# Patient Record
Sex: Female | Born: 1965 | Race: White | Hispanic: No | Smoking: Never smoker
Health system: Southern US, Community
[De-identification: ages and names within clinical notes are randomized; demographics above are authoritative.]

---

## 2002-03-01 ENCOUNTER — Encounter: Admission: RE | Admit: 2002-03-01 | Discharge: 2002-03-01 | Payer: Self-pay | Admitting: Family Medicine

## 2002-03-01 ENCOUNTER — Encounter: Payer: Self-pay | Admitting: Family Medicine

## 2003-05-13 ENCOUNTER — Encounter: Admission: RE | Admit: 2003-05-13 | Discharge: 2003-05-13 | Payer: Self-pay | Admitting: Family Medicine

## 2003-05-13 ENCOUNTER — Other Ambulatory Visit: Admission: RE | Admit: 2003-05-13 | Discharge: 2003-05-13 | Payer: Self-pay | Admitting: Family Medicine

## 2004-05-14 ENCOUNTER — Other Ambulatory Visit: Admission: RE | Admit: 2004-05-14 | Discharge: 2004-05-14 | Payer: Self-pay | Admitting: Family Medicine

## 2004-05-18 ENCOUNTER — Encounter: Admission: RE | Admit: 2004-05-18 | Discharge: 2004-05-18 | Payer: Self-pay | Admitting: Family Medicine

## 2004-11-19 ENCOUNTER — Encounter: Admission: RE | Admit: 2004-11-19 | Discharge: 2004-11-19 | Payer: Self-pay | Admitting: Family Medicine

## 2006-05-25 ENCOUNTER — Other Ambulatory Visit: Admission: RE | Admit: 2006-05-25 | Discharge: 2006-05-25 | Payer: Self-pay | Admitting: Family Medicine

## 2008-05-23 ENCOUNTER — Other Ambulatory Visit: Admission: RE | Admit: 2008-05-23 | Discharge: 2008-05-23 | Payer: Self-pay | Admitting: Family Medicine

## 2009-08-18 ENCOUNTER — Other Ambulatory Visit: Admission: RE | Admit: 2009-08-18 | Discharge: 2009-08-18 | Payer: Self-pay | Admitting: Family Medicine

## 2010-08-27 ENCOUNTER — Other Ambulatory Visit: Payer: Self-pay | Admitting: Family Medicine

## 2010-08-27 ENCOUNTER — Other Ambulatory Visit (HOSPITAL_COMMUNITY)
Admission: RE | Admit: 2010-08-27 | Discharge: 2010-08-27 | Disposition: A | Discharge status: Home / Self Care | Payer: BC Managed Care – PPO | Source: Ambulatory Visit | Attending: Family Medicine | Admitting: Family Medicine

## 2010-08-27 DIAGNOSIS — Z124 Encounter for screening for malignant neoplasm of cervix: Secondary | ICD-10-CM | POA: Insufficient documentation

## 2012-09-07 ENCOUNTER — Other Ambulatory Visit (HOSPITAL_COMMUNITY)
Admission: RE | Admit: 2012-09-07 | Discharge: 2012-09-07 | Disposition: A | Discharge status: Home / Self Care | Payer: BC Managed Care – PPO | Source: Ambulatory Visit | Attending: Family Medicine | Admitting: Family Medicine

## 2012-09-07 ENCOUNTER — Other Ambulatory Visit: Payer: Self-pay | Admitting: Family Medicine

## 2012-09-07 DIAGNOSIS — Z124 Encounter for screening for malignant neoplasm of cervix: Secondary | ICD-10-CM | POA: Insufficient documentation

## 2013-01-14 ENCOUNTER — Emergency Department (HOSPITAL_COMMUNITY)
Admission: EM | Admit: 2013-01-14 | Discharge: 2013-01-14 | Disposition: A | Discharge status: Home / Self Care | Payer: BC Managed Care – PPO | Source: Home / Self Care

## 2013-01-14 ENCOUNTER — Encounter (HOSPITAL_COMMUNITY): Payer: Self-pay

## 2013-01-14 DIAGNOSIS — M25529 Pain in unspecified elbow: Secondary | ICD-10-CM

## 2013-01-14 DIAGNOSIS — M25522 Pain in left elbow: Secondary | ICD-10-CM

## 2013-01-14 MED ORDER — MELOXICAM 15 MG PO TABS: 15.0000 mg | ORAL_TABLET | Freq: Every day | ORAL | Status: AC | PRN

## 2013-01-14 NOTE — ED Provider Notes (Signed)
Amanda Huber is a 47 y.o. female who presents to Urgent Care today for left elbow pain. Patient was lifting a heavy object today when she nearly dropped it and caught it again. She felt immediate pain in her flexor elbow in the mid radial forearm. She notes the pain is quite intense especially with elbow extension and supination. She denies any radiating pain or numbness or distal weakness. She is not tried any medications. She denies any previous injuries like this. No fevers or chills.    PMH reviewed. Healthy otherwise History  Substance Use Topics  . Smoking status: Never Smoker   . Smokeless tobacco: Not on file  . Alcohol Use: No   ROS as above Medications reviewed. No current facility-administered medications for this encounter.   Current Outpatient Prescriptions  Medication Sig Dispense Refill  . Levonorg-Eth Estrad Triphasic (TRIVORA, 28, PO) Take by mouth daily.      . meloxicam (MOBIC) 15 MG tablet Take 1 tablet (15 mg total) by mouth daily as needed for pain.  30 tablet  0    Exam:  BP 148/88  Pulse 73  Temp(Src) 98.6 F (37 C) (Oral)  Resp 18  SpO2 100% Gen: Well NAD Left elbow: Normal-appearing no ecchymosis Tender at the insertion of the distal biceps under the radius.  Tender palpation over the course of the distal biceps distally.  Pain with supination and extension.  Capillary refill sensation and grip strength intact distally.   Limited musculoskeletal ultrasound of the left medial elbow.  Biceps tendon visualized distally. It appears to be intact however I am unable to visualize the distal 2-3 cm. This is possibly due to tear or difficulty position the patient due to pain.   No results found for this or any previous visit (from the past 24 hour(s)). No results found.  Assessment and Plan: 47 y.o. female with possible distal biceps tear.  Plan to place patient into a shoulder immobilizer for for comfort as this will maintain elbow flexion. Meloxicam as  needed for pain.  Followup with orthopedics tomorrow or Tuesday.  Discussed warning signs or symptoms. Please see discharge instructions. Patient expresses understanding.      Rodolph Bong, MD 01/14/13 [phone]

## 2013-01-14 NOTE — ED Notes (Signed)
Patient c/o left arm pain, injured today lifting a fence

## 2013-01-15 ENCOUNTER — Other Ambulatory Visit: Payer: Self-pay | Admitting: Orthopedic Surgery

## 2013-01-15 DIAGNOSIS — M25522 Pain in left elbow: Secondary | ICD-10-CM

## 2013-01-16 ENCOUNTER — Ambulatory Visit
Admission: RE | Admit: 2013-01-16 | Discharge: 2013-01-16 | Disposition: A | Discharge status: Home / Self Care | Payer: BC Managed Care – PPO | Source: Ambulatory Visit | Attending: Orthopedic Surgery | Admitting: Orthopedic Surgery

## 2013-01-16 DIAGNOSIS — M25522 Pain in left elbow: Secondary | ICD-10-CM

## 2013-09-20 ENCOUNTER — Other Ambulatory Visit: Payer: Self-pay

## 2013-09-20 DIAGNOSIS — Z1231 Encounter for screening mammogram for malignant neoplasm of breast: Secondary | ICD-10-CM

## 2013-10-10 ENCOUNTER — Ambulatory Visit
Admission: RE | Admit: 2013-10-10 | Discharge: 2013-10-10 | Disposition: A | Discharge status: Home / Self Care | Payer: BC Managed Care – PPO | Source: Ambulatory Visit

## 2013-10-10 ENCOUNTER — Encounter (INDEPENDENT_AMBULATORY_CARE_PROVIDER_SITE_OTHER): Payer: Self-pay

## 2013-10-10 DIAGNOSIS — Z1231 Encounter for screening mammogram for malignant neoplasm of breast: Secondary | ICD-10-CM

## 2014-10-01 ENCOUNTER — Other Ambulatory Visit: Payer: Self-pay

## 2014-10-01 DIAGNOSIS — Z1231 Encounter for screening mammogram for malignant neoplasm of breast: Secondary | ICD-10-CM

## 2014-10-15 ENCOUNTER — Ambulatory Visit
Admission: RE | Admit: 2014-10-15 | Discharge: 2014-10-15 | Disposition: A | Discharge status: Home / Self Care | Payer: BLUE CROSS/BLUE SHIELD | Source: Ambulatory Visit

## 2014-10-15 DIAGNOSIS — Z1231 Encounter for screening mammogram for malignant neoplasm of breast: Secondary | ICD-10-CM

## 2015-09-30 ENCOUNTER — Other Ambulatory Visit: Payer: Self-pay | Admitting: Family Medicine

## 2015-09-30 ENCOUNTER — Other Ambulatory Visit (HOSPITAL_COMMUNITY)
Admission: RE | Admit: 2015-09-30 | Discharge: 2015-09-30 | Disposition: A | Discharge status: Home / Self Care | Payer: BLUE CROSS/BLUE SHIELD | Source: Ambulatory Visit | Attending: Family Medicine | Admitting: Family Medicine

## 2015-09-30 DIAGNOSIS — Z124 Encounter for screening for malignant neoplasm of cervix: Secondary | ICD-10-CM | POA: Insufficient documentation

## 2015-09-30 DIAGNOSIS — Z13220 Encounter for screening for lipoid disorders: Secondary | ICD-10-CM | POA: Diagnosis not present

## 2015-09-30 DIAGNOSIS — Z0001 Encounter for general adult medical examination with abnormal findings: Secondary | ICD-10-CM | POA: Diagnosis not present

## 2015-10-02 LAB — CYTOLOGY - PAP

## 2015-10-27 ENCOUNTER — Other Ambulatory Visit: Payer: Self-pay

## 2015-10-27 DIAGNOSIS — Z1231 Encounter for screening mammogram for malignant neoplasm of breast: Secondary | ICD-10-CM

## 2015-11-04 ENCOUNTER — Ambulatory Visit
Admission: RE | Admit: 2015-11-04 | Discharge: 2015-11-04 | Disposition: A | Discharge status: Home / Self Care | Payer: BLUE CROSS/BLUE SHIELD | Source: Ambulatory Visit

## 2015-11-04 DIAGNOSIS — Z1231 Encounter for screening mammogram for malignant neoplasm of breast: Secondary | ICD-10-CM

## 2016-01-19 DIAGNOSIS — A630 Anogenital (venereal) warts: Secondary | ICD-10-CM | POA: Diagnosis not present

## 2016-01-19 DIAGNOSIS — R350 Frequency of micturition: Secondary | ICD-10-CM | POA: Diagnosis not present

## 2016-01-19 DIAGNOSIS — Z113 Encounter for screening for infections with a predominantly sexual mode of transmission: Secondary | ICD-10-CM | POA: Diagnosis not present

## 2016-05-19 DIAGNOSIS — H524 Presbyopia: Secondary | ICD-10-CM | POA: Diagnosis not present

## 2016-09-16 DIAGNOSIS — F4323 Adjustment disorder with mixed anxiety and depressed mood: Secondary | ICD-10-CM | POA: Diagnosis not present

## 2016-09-24 ENCOUNTER — Other Ambulatory Visit: Payer: Self-pay | Admitting: Family Medicine

## 2016-09-24 DIAGNOSIS — Z1231 Encounter for screening mammogram for malignant neoplasm of breast: Secondary | ICD-10-CM

## 2016-10-19 DIAGNOSIS — Z0001 Encounter for general adult medical examination with abnormal findings: Secondary | ICD-10-CM | POA: Diagnosis not present

## 2016-11-04 ENCOUNTER — Ambulatory Visit
Admission: RE | Admit: 2016-11-04 | Discharge: 2016-11-04 | Disposition: A | Discharge status: Home / Self Care | Payer: BLUE CROSS/BLUE SHIELD | Source: Ambulatory Visit | Attending: Family Medicine | Admitting: Family Medicine

## 2016-11-04 DIAGNOSIS — Z1231 Encounter for screening mammogram for malignant neoplasm of breast: Secondary | ICD-10-CM

## 2016-11-16 DIAGNOSIS — M7712 Lateral epicondylitis, left elbow: Secondary | ICD-10-CM | POA: Diagnosis not present

## 2016-11-16 DIAGNOSIS — M25551 Pain in right hip: Secondary | ICD-10-CM | POA: Diagnosis not present

## 2016-11-16 DIAGNOSIS — M545 Low back pain: Secondary | ICD-10-CM | POA: Diagnosis not present

## 2017-01-25 DIAGNOSIS — M79671 Pain in right foot: Secondary | ICD-10-CM | POA: Diagnosis not present

## 2017-03-03 ENCOUNTER — Other Ambulatory Visit: Payer: Self-pay | Admitting: Family Medicine

## 2017-03-03 ENCOUNTER — Ambulatory Visit
Admission: RE | Admit: 2017-03-03 | Discharge: 2017-03-03 | Disposition: A | Discharge status: Home / Self Care | Payer: BLUE CROSS/BLUE SHIELD | Source: Ambulatory Visit | Attending: Family Medicine | Admitting: Family Medicine

## 2017-03-03 DIAGNOSIS — M79671 Pain in right foot: Secondary | ICD-10-CM

## 2017-03-03 DIAGNOSIS — M19071 Primary osteoarthritis, right ankle and foot: Secondary | ICD-10-CM | POA: Diagnosis not present

## 2017-03-15 DIAGNOSIS — Z23 Encounter for immunization: Secondary | ICD-10-CM | POA: Diagnosis not present

## 2017-03-16 DIAGNOSIS — Z713 Dietary counseling and surveillance: Secondary | ICD-10-CM | POA: Diagnosis not present

## 2017-03-18 DIAGNOSIS — H04123 Dry eye syndrome of bilateral lacrimal glands: Secondary | ICD-10-CM | POA: Diagnosis not present

## 2017-04-21 DIAGNOSIS — F4320 Adjustment disorder, unspecified: Secondary | ICD-10-CM | POA: Diagnosis not present

## 2017-05-03 ENCOUNTER — Ambulatory Visit
Admission: RE | Admit: 2017-05-03 | Discharge: 2017-05-03 | Disposition: A | Discharge status: Home / Self Care | Payer: BLUE CROSS/BLUE SHIELD | Source: Ambulatory Visit | Attending: Family Medicine | Admitting: Family Medicine

## 2017-05-03 ENCOUNTER — Other Ambulatory Visit: Payer: Self-pay | Admitting: Family Medicine

## 2017-05-03 DIAGNOSIS — M79644 Pain in right finger(s): Secondary | ICD-10-CM | POA: Diagnosis not present

## 2017-05-03 DIAGNOSIS — M7989 Other specified soft tissue disorders: Secondary | ICD-10-CM | POA: Diagnosis not present

## 2017-05-11 DIAGNOSIS — R52 Pain, unspecified: Secondary | ICD-10-CM | POA: Diagnosis not present

## 2017-06-20 DIAGNOSIS — Z713 Dietary counseling and surveillance: Secondary | ICD-10-CM | POA: Diagnosis not present

## 2017-08-05 DIAGNOSIS — N951 Menopausal and female climacteric states: Secondary | ICD-10-CM | POA: Diagnosis not present

## 2017-08-05 DIAGNOSIS — R635 Abnormal weight gain: Secondary | ICD-10-CM | POA: Diagnosis not present

## 2017-08-09 DIAGNOSIS — Z1339 Encounter for screening examination for other mental health and behavioral disorders: Secondary | ICD-10-CM | POA: Diagnosis not present

## 2017-08-09 DIAGNOSIS — R232 Flushing: Secondary | ICD-10-CM | POA: Diagnosis not present

## 2017-08-09 DIAGNOSIS — Z1331 Encounter for screening for depression: Secondary | ICD-10-CM | POA: Diagnosis not present

## 2017-08-09 DIAGNOSIS — R5383 Other fatigue: Secondary | ICD-10-CM | POA: Diagnosis not present

## 2017-08-09 DIAGNOSIS — G479 Sleep disorder, unspecified: Secondary | ICD-10-CM | POA: Diagnosis not present

## 2017-09-19 DIAGNOSIS — Z713 Dietary counseling and surveillance: Secondary | ICD-10-CM | POA: Diagnosis not present

## 2017-09-20 DIAGNOSIS — Z23 Encounter for immunization: Secondary | ICD-10-CM | POA: Diagnosis not present

## 2017-10-12 ENCOUNTER — Other Ambulatory Visit: Payer: Self-pay

## 2017-10-12 DIAGNOSIS — Z1231 Encounter for screening mammogram for malignant neoplasm of breast: Secondary | ICD-10-CM

## 2017-11-09 ENCOUNTER — Ambulatory Visit
Admission: RE | Admit: 2017-11-09 | Discharge: 2017-11-09 | Disposition: A | Discharge status: Home / Self Care | Payer: BLUE CROSS/BLUE SHIELD | Source: Ambulatory Visit | Attending: Family Medicine | Admitting: Family Medicine

## 2017-11-09 DIAGNOSIS — Z1231 Encounter for screening mammogram for malignant neoplasm of breast: Secondary | ICD-10-CM

## 2017-11-22 DIAGNOSIS — Z0000 Encounter for general adult medical examination without abnormal findings: Secondary | ICD-10-CM | POA: Diagnosis not present

## 2017-11-29 DIAGNOSIS — F4320 Adjustment disorder, unspecified: Secondary | ICD-10-CM | POA: Diagnosis not present

## 2017-12-02 ENCOUNTER — Ambulatory Visit: Payer: BLUE CROSS/BLUE SHIELD | Admitting: Podiatry

## 2017-12-02 ENCOUNTER — Telehealth: Payer: Self-pay | Admitting: Podiatry

## 2017-12-02 ENCOUNTER — Encounter: Payer: Self-pay | Admitting: Podiatry

## 2017-12-02 ENCOUNTER — Ambulatory Visit (INDEPENDENT_AMBULATORY_CARE_PROVIDER_SITE_OTHER): Payer: BLUE CROSS/BLUE SHIELD

## 2017-12-02 DIAGNOSIS — L84 Corns and callosities: Secondary | ICD-10-CM

## 2017-12-02 DIAGNOSIS — M6740 Ganglion, unspecified site: Secondary | ICD-10-CM

## 2017-12-02 DIAGNOSIS — M898X9 Other specified disorders of bone, unspecified site: Secondary | ICD-10-CM

## 2017-12-02 DIAGNOSIS — M7662 Achilles tendinitis, left leg: Secondary | ICD-10-CM | POA: Diagnosis not present

## 2017-12-02 MED ORDER — DICLOFENAC SODIUM 1 % TD GEL: 2.0000 g | Freq: Four times a day (QID) | TRANSDERMAL | 2 refills | Status: DC

## 2017-12-02 NOTE — Patient Instructions (Signed)
Achilles Tendinitis  with Rehab Achilles tendinitis is a disorder of the Achilles tendon. The Achilles tendon connects the large calf muscles (Gastrocnemius and Soleus) to the heel bone (calcaneus). This tendon is sometimes called the heel cord. It is important for pushing-off and standing on your toes and is important for walking, running, or jumping. Tendinitis is often caused by overuse and repetitive microtrauma. SYMPTOMS  Pain, tenderness, swelling, warmth, and redness may occur over the Achilles tendon even at rest.  Pain with pushing off, or flexing or extending the ankle.  Pain that is worsened after or during activity. CAUSES   Overuse sometimes seen with rapid increase in exercise programs or in sports requiring running and jumping.  Poor physical conditioning (strength and flexibility or endurance).  Running sports, especially training running down hills.  Inadequate warm-up before practice or play or failure to stretch before participation.  Injury to the tendon. PREVENTION   Warm up and stretch before practice or competition.  Allow time for adequate rest and recovery between practices and competition.  Keep up conditioning.  Keep up ankle and leg flexibility.  Improve or keep muscle strength and endurance.  Improve cardiovascular fitness.  Use proper technique.  Use proper equipment (shoes, skates).  To help prevent recurrence, taping, protective strapping, or an adhesive bandage may be recommended for several weeks after healing is complete. PROGNOSIS   Recovery may take weeks to several months to heal.  Longer recovery is expected if symptoms have been prolonged.  Recovery is usually quicker if the inflammation is due to a direct blow as compared with overuse or sudden strain. RELATED COMPLICATIONS   Healing time will be prolonged if the condition is not correctly treated. The injury must be given plenty of time to heal.  Symptoms can reoccur if  activity is resumed too soon.  Untreated, tendinitis may increase the risk of tendon rupture requiring additional time for recovery and possibly surgery. TREATMENT   The first treatment consists of rest anti-inflammatory medication, and ice to relieve the pain.  Stretching and strengthening exercises after resolution of pain will likely help reduce the risk of recurrence. Referral to a physical therapist or athletic trainer for further evaluation and treatment may be helpful.  A walking boot or cast may be recommended to rest the Achilles tendon. This can help break the cycle of inflammation and microtrauma.  Arch supports (orthotics) may be prescribed or recommended by your caregiver as an adjunct to therapy and rest.  Surgery to remove the inflamed tendon lining or degenerated tendon tissue is rarely necessary and has shown less than predictable results. MEDICATION   Nonsteroidal anti-inflammatory medications, such as aspirin and ibuprofen, may be used for pain and inflammation relief. Do not take within 7 days before surgery. Take these as directed by your caregiver. Contact your caregiver immediately if any bleeding, stomach upset, or signs of allergic reaction occur. Other minor pain relievers, such as acetaminophen, may also be used.  Pain relievers may be prescribed as necessary by your caregiver. Do not take prescription pain medication for longer than 4 to 7 days. Use only as directed and only as much as you need. HEAT AND COLD  Cold is used to relieve pain and reduce inflammation for acute and chronic Achilles tendinitis. Cold should be applied for 10 to 15 minutes every 2 to 3 hours for inflammation and pain and immediately after any activity that aggravates your symptoms. Use ice packs or an ice massage.  Heat may be used   before performing stretching and strengthening activities prescribed by your caregiver. Use a heat pack or a warm soak. SEEK MEDICAL CARE IF:  Symptoms get  worse or do not improve in 2 weeks despite treatment.  New, unexplained symptoms develop. Drugs used in treatment may produce side effects.   EXERCISES-- hold each stretch for 30 seconds and repeat 10 times.  Complete each stretch 3 times per day.   RANGE OF MOTION (ROM) AND STRETCHING EXERCISES - Achilles Tendinitis  These exercises may help you when beginning to rehabilitate your injury. Your symptoms may resolve with or without further involvement from your physician, physical therapist or athletic trainer. While completing these exercises, remember:   Restoring tissue flexibility helps normal motion to return to the joints. This allows healthier, less painful movement and activity.  An effective stretch should be held for at least 30 seconds.  A stretch should never be painful. You should only feel a gentle lengthening or release in the stretched tissue. STRETCH  Gastroc, Standing   Place hands on wall.  Extend right / left leg, keeping the front knee somewhat bent.  Slightly point your toes inward on your back foot.  Keeping your right / left heel on the floor and your knee straight, shift your weight toward the wall, not allowing your back to arch.  You should feel a gentle stretch in the right / left calf. Hold this position for __________ seconds. Repeat __________ times. Complete this stretch __________ times per day. STRETCH  Soleus, Standing   Place hands on wall.  Extend right / left leg, keeping the other knee somewhat bent.  Slightly point your toes inward on your back foot.  Keep your right / left heel on the floor, bend your back knee, and slightly shift your weight over the back leg so that you feel a gentle stretch deep in your back calf.  Hold this position for __________ seconds. Repeat __________ times. Complete this stretch __________ times per day. STRETCH  Gastrocsoleus, Standing  Note: This exercise can place a lot of stress on your foot and ankle.  Please complete this exercise only if specifically instructed by your caregiver.   Place the ball of your right / left foot on a step, keeping your other foot firmly on the same step.  Hold on to the wall or a rail for balance.  Slowly lift your other foot, allowing your body weight to press your heel down over the edge of the step.  You should feel a stretch in your right / left calf.  Hold this position for __________ seconds.  Repeat this exercise with a slight bend in your knee. Repeat __________ times. Complete this stretch __________ times per day.  STRENGTHENING EXERCISES - Achilles Tendinitis These exercises may help you when beginning to rehabilitate your injury. They may resolve your symptoms with or without further involvement from your physician, physical therapist or athletic trainer. While completing these exercises, remember:   Muscles can gain both the endurance and the strength needed for everyday activities through controlled exercises.  Complete these exercises as instructed by your physician, physical therapist or athletic trainer. Progress the resistance and repetitions only as guided.  You may experience muscle soreness or fatigue, but the pain or discomfort you are trying to eliminate should never worsen during these exercises. If this pain does worsen, stop and make certain you are following the directions exactly. If the pain is still present after adjustments, discontinue the exercise until you can discuss   the trouble with your clinician. STRENGTH - Plantar-flexors   Sit with your right / left leg extended. Holding onto both ends of a rubber exercise band/tubing, loop it around the ball of your foot. Keep a slight tension in the band.  Slowly push your toes away from you, pointing them downward.  Hold this position for __________ seconds. Return slowly, controlling the tension in the band/tubing. Repeat __________ times. Complete this exercise __________ times  per day.  STRENGTH - Plantar-flexors   Stand with your feet shoulder width apart. Steady yourself with a wall or table using as little support as needed.  Keeping your weight evenly spread over the width of your feet, rise up on your toes.*  Hold this position for __________ seconds. Repeat __________ times. Complete this exercise __________ times per day.  *If this is too easy, shift your weight toward your right / left leg until you feel challenged. Ultimately, you may be asked to do this exercise with your right / left foot only. STRENGTH  Plantar-flexors, Eccentric  Note: This exercise can place a lot of stress on your foot and ankle. Please complete this exercise only if specifically instructed by your caregiver.   Place the balls of your feet on a step. With your hands, use only enough support from a wall or rail to keep your balance.  Keep your knees straight and rise up on your toes.  Slowly shift your weight entirely to your right / left toes and pick up your opposite foot. Gently and with controlled movement, lower your weight through your right / left foot so that your heel drops below the level of the step. You will feel a slight stretch in the back of your calf at the end position.  Use the healthy leg to help rise up onto the balls of both feet, then lower weight only on the right / left leg again. Build up to 15 repetitions. Then progress to 3 consecutive sets of 15 repetitions.*  After completing the above exercise, complete the same exercise with a slight knee bend (about 30 degrees). Again, build up to 15 repetitions. Then progress to 3 consecutive sets of 15 repetitions.* Perform this exercise __________ times per day.  *When you easily complete 3 sets of 15, your physician, physical therapist or athletic trainer may advise you to add resistance by wearing a backpack filled with additional weight. STRENGTH - Plantar Flexors, Seated   Sit on a chair that allows your feet  to rest flat on the ground. If necessary, sit at the edge of the chair.  Keeping your toes firmly on the ground, lift your right / left heel as far as you can without increasing any discomfort in your ankle. Repeat __________ times. Complete this exercise __________ times a day. *If instructed by your physician, physical therapist or athletic trainer, you may add ____________________ of resistance by placing a weighted object on your right / left knee. Document Released: 12/30/2004 Document Revised: 08/23/2011 Document Reviewed: 09/12/2008 [employer] Patient Information 2014 [employer].    

## 2017-12-02 NOTE — Telephone Encounter (Signed)
Pt. Thought she was suppose to receive a toe wrap. She wasent sure if that came from the Pharmacy or if she was to get it from office. Please call to advise.

## 2017-12-02 NOTE — Progress Notes (Signed)
Subjective:    Patient ID: Amanda Huber, female    DOB: 1966-01-19, 52 y.o.   MRN: [id number]  HPI 52 year old female presents the office today for concerns of left foot pain describes a "cyst" to the area.  This is been ongoing for about 20 years after she drops and on her foot.  She states that she has been trying to release her shoes and she states that when doing this is taking some pressure off and she only gets pain to the area after running but she has no pain with running or with activity.  She also has a callus on the right big toe.  She has no other concerns today.  No recent swelling denies any recent injury or trauma.  No other concerns today.   Review of Systems  All other systems reviewed and are negative.  History reviewed. No pertinent past medical history.  History reviewed. No pertinent surgical history.   Current Outpatient Medications:  .  diclofenac sodium (VOLTAREN) 1 % GEL, Apply 2 g topically 4 (four) times daily. Rub into affected area of foot 2 to 4 times daily, Disp: 100 g, Rfl: 2 .  Levonorg-Eth Estrad Triphasic (TRIVORA, 28, PO), Take by mouth daily., Disp: , Rfl:  .  meloxicam (MOBIC) 15 MG tablet, Take 1 tablet (15 mg total) by mouth daily as needed for pain. (Patient not taking: Reported on 12/02/2017), Disp: 30 tablet, Rfl: 0  No Known Allergies       Objective:   Physical Exam  General: AAO x3, NAD  Dermatological: Skin is warm, dry and supple bilateral. Nails x 10 are well manicured; remaining integument appears unremarkable at this time. There are no open sores, no preulcerative lesions, no rash or signs of infection present.  Vascular: Dorsalis Pedis artery and Posterior Tibial artery pedal pulses are 2/4 bilateral with immedate capillary fill time. There is no pain with calf compression, swelling, warmth, erythema.   Neruologic: Grossly intact via light touch bilateral. Vibratory intact via tuning fork bilateral. Protective threshold with Semmes  Wienstein monofilament intact to all pedal sites bilateral.   Musculoskeletal: On the dorsal medial aspect the patient's left long first metatarsal cuneiform joint is a bony exostosis palpable.  There is no tenderness palpation there is no erythema or increase in warmth.  There is no skin breakdown.  There is no fluid-filled mass identified.  Hyperkeratotic lesion to the medial aspect of the right hallux with tenderness palpation of this area.  Also upon evaluation there is discomfort on the mid substance of the Achilles tendon she states that she is been having some discomfort in that area as well.  Overall Thompson test is negative and Achilles tendon appears to be intact.  No pain with lateral compression of the calcaneus.  No other areas of tenderness identified at this time.  Muscular strength 5/5 in all groups tested bilateral.  Gait: Unassisted, Nonantalgic.     Assessment & Plan:  52 year old female with left dorsal exostosis, right medial hallux hyperkeratotic lesion and right Achilles tendinitis -Treatment options discussed including all alternatives, risks, and complications -Etiology of symptoms were discussed -X-rays were obtained and reviewed with the patient.  x-rays were obtained on the left foot.  There is a bony exostosis palpable off of the dorsal medial foot on the first metatarsal cuneiform joint.  No evidence of acute fracture identified at this time. -Her main concern of the left foot pain.  Unfortunately is due to bone.  We discussed releasing  her shoes to not put pressure to this area we also discussed offloading pads.  Currently not symptomatically she become painful would consider steroid injection and anti-inflammatories.  Discussed supportive shoes and arch supports.  In regards to the right foot discussed stretching, icing as well as again wearing good supportive shoes.  I debrided the hyperkeratotic lesion to any complications of bleeding and dispensed offloading pad.   However she left the office without the offloading pads today but we did contact her about this.  Vivi BarrackMatthew R Karliah Kowalchuk DPM

## 2017-12-02 NOTE — Telephone Encounter (Signed)
Yes, I relized after she left she did not get it. It is one of the silicone covers for the big toe, we can send her the cover one and one with the tan cover

## 2017-12-05 ENCOUNTER — Telehealth: Payer: Self-pay | Admitting: Podiatry

## 2017-12-05 NOTE — Telephone Encounter (Signed)
Called patient on Friday June 21st and left a message stating  that I would mail these to her on Monday. Amanda Huber

## 2017-12-05 NOTE — Telephone Encounter (Signed)
Patient called about the pharmacy sending a fax to our office to get the prescription approved. Theres steps that need to be completed in order for the insurance to cover it- per her insurance company. If you can call patient back at [phone]

## 2017-12-05 NOTE — Telephone Encounter (Signed)
Tomasa HoseJ. Quintana, RN states she is currently working this one.

## 2017-12-05 NOTE — Telephone Encounter (Signed)
Thanks for working on this. Can someone just call her back to let her know that we are working on this for her? Thanks.

## 2017-12-06 ENCOUNTER — Telehealth: Payer: Self-pay | Admitting: Podiatry

## 2017-12-06 MED ORDER — DICLOFENAC SODIUM 1 % TD GEL
2.0000 g | Freq: Four times a day (QID) | TRANSDERMAL | 2 refills | Status: AC
Start: 2017-12-06 — End: ?

## 2017-12-06 NOTE — Telephone Encounter (Signed)
Left message informing pt her prescription pre-cert was being worked on.

## 2017-12-06 NOTE — Addendum Note (Signed)
Addended by: Alphia Kava'CONNELL, VALERY D on: 12/06/2017 03:47 PM   Modules accepted: Orders

## 2017-12-06 NOTE — Telephone Encounter (Signed)
Pt needs prescription re-called in due to the pre-authorization.[id number] [place]  [address]

## 2017-12-06 NOTE — Telephone Encounter (Signed)
Left message informing pt I had seen her PA request in the list to be performed and it may have been performed prior to my call to her and another request sent in between the approved one, but if it was approved [facility]

## 2017-12-06 NOTE — Telephone Encounter (Signed)
Pt called and stated that she spoke to insurance company and said they approved it until 11/2018. Please give her a call about the prescription. Patient is getting very frustrated.

## 2017-12-12 DIAGNOSIS — Z713 Dietary counseling and surveillance: Secondary | ICD-10-CM | POA: Diagnosis not present

## 2018-01-23 DIAGNOSIS — M6281 Muscle weakness (generalized): Secondary | ICD-10-CM | POA: Diagnosis not present

## 2018-01-23 DIAGNOSIS — M79604 Pain in right leg: Secondary | ICD-10-CM | POA: Diagnosis not present

## 2018-01-23 DIAGNOSIS — M79605 Pain in left leg: Secondary | ICD-10-CM | POA: Diagnosis not present

## 2018-01-25 DIAGNOSIS — M79604 Pain in right leg: Secondary | ICD-10-CM | POA: Diagnosis not present

## 2018-01-25 DIAGNOSIS — M6281 Muscle weakness (generalized): Secondary | ICD-10-CM | POA: Diagnosis not present

## 2018-01-25 DIAGNOSIS — M79605 Pain in left leg: Secondary | ICD-10-CM | POA: Diagnosis not present

## 2018-01-30 DIAGNOSIS — M79605 Pain in left leg: Secondary | ICD-10-CM | POA: Diagnosis not present

## 2018-01-30 DIAGNOSIS — M6281 Muscle weakness (generalized): Secondary | ICD-10-CM | POA: Diagnosis not present

## 2018-01-30 DIAGNOSIS — M79604 Pain in right leg: Secondary | ICD-10-CM | POA: Diagnosis not present

## 2018-02-01 DIAGNOSIS — M79604 Pain in right leg: Secondary | ICD-10-CM | POA: Diagnosis not present

## 2018-02-01 DIAGNOSIS — M6281 Muscle weakness (generalized): Secondary | ICD-10-CM | POA: Diagnosis not present

## 2018-02-01 DIAGNOSIS — M79605 Pain in left leg: Secondary | ICD-10-CM | POA: Diagnosis not present

## 2018-02-08 DIAGNOSIS — M6281 Muscle weakness (generalized): Secondary | ICD-10-CM | POA: Diagnosis not present

## 2018-02-08 DIAGNOSIS — M79604 Pain in right leg: Secondary | ICD-10-CM | POA: Diagnosis not present

## 2018-02-08 DIAGNOSIS — M79605 Pain in left leg: Secondary | ICD-10-CM | POA: Diagnosis not present

## 2018-03-23 DIAGNOSIS — H5203 Hypermetropia, bilateral: Secondary | ICD-10-CM | POA: Diagnosis not present

## 2018-04-27 DIAGNOSIS — K5900 Constipation, unspecified: Secondary | ICD-10-CM | POA: Diagnosis not present

## 2018-10-16 ENCOUNTER — Other Ambulatory Visit: Payer: Self-pay | Admitting: Family Medicine

## 2018-10-16 DIAGNOSIS — Z1231 Encounter for screening mammogram for malignant neoplasm of breast: Secondary | ICD-10-CM

## 2018-11-16 ENCOUNTER — Ambulatory Visit: Payer: BLUE CROSS/BLUE SHIELD

## 2018-12-01 DIAGNOSIS — Z0000 Encounter for general adult medical examination without abnormal findings: Secondary | ICD-10-CM | POA: Diagnosis not present

## 2018-12-13 DIAGNOSIS — I10 Essential (primary) hypertension: Secondary | ICD-10-CM | POA: Diagnosis not present

## 2018-12-14 DIAGNOSIS — R8290 Unspecified abnormal findings in urine: Secondary | ICD-10-CM | POA: Diagnosis not present

## 2018-12-14 DIAGNOSIS — I10 Essential (primary) hypertension: Secondary | ICD-10-CM | POA: Diagnosis not present

## 2018-12-26 ENCOUNTER — Ambulatory Visit
Admission: RE | Admit: 2018-12-26 | Discharge: 2018-12-26 | Disposition: A | Discharge status: Home / Self Care | Payer: BC Managed Care – PPO | Source: Ambulatory Visit | Attending: Family Medicine | Admitting: Family Medicine

## 2018-12-26 ENCOUNTER — Other Ambulatory Visit: Payer: Self-pay

## 2018-12-26 DIAGNOSIS — Z1231 Encounter for screening mammogram for malignant neoplasm of breast: Secondary | ICD-10-CM | POA: Diagnosis not present

## 2018-12-28 DIAGNOSIS — L739 Follicular disorder, unspecified: Secondary | ICD-10-CM | POA: Diagnosis not present

## 2019-02-15 ENCOUNTER — Other Ambulatory Visit: Payer: Self-pay | Admitting: Family Medicine

## 2019-02-15 DIAGNOSIS — R8290 Unspecified abnormal findings in urine: Secondary | ICD-10-CM

## 2019-02-27 ENCOUNTER — Ambulatory Visit
Admission: RE | Admit: 2019-02-27 | Discharge: 2019-02-27 | Disposition: A | Discharge status: Home / Self Care | Payer: BC Managed Care – PPO | Source: Ambulatory Visit | Attending: Family Medicine | Admitting: Family Medicine

## 2019-02-27 DIAGNOSIS — R319 Hematuria, unspecified: Secondary | ICD-10-CM | POA: Diagnosis not present

## 2019-02-27 DIAGNOSIS — R8290 Unspecified abnormal findings in urine: Secondary | ICD-10-CM

## 2019-02-28 DIAGNOSIS — M25562 Pain in left knee: Secondary | ICD-10-CM | POA: Diagnosis not present

## 2019-03-08 DIAGNOSIS — M25562 Pain in left knee: Secondary | ICD-10-CM | POA: Diagnosis not present

## 2019-03-30 DIAGNOSIS — H5203 Hypermetropia, bilateral: Secondary | ICD-10-CM | POA: Diagnosis not present

## 2019-04-13 DIAGNOSIS — R3121 Asymptomatic microscopic hematuria: Secondary | ICD-10-CM | POA: Diagnosis not present

## 2019-04-27 DIAGNOSIS — R262 Difficulty in walking, not elsewhere classified: Secondary | ICD-10-CM | POA: Diagnosis not present

## 2019-04-27 DIAGNOSIS — M25562 Pain in left knee: Secondary | ICD-10-CM | POA: Diagnosis not present

## 2019-04-27 DIAGNOSIS — M6281 Muscle weakness (generalized): Secondary | ICD-10-CM | POA: Diagnosis not present

## 2019-05-01 DIAGNOSIS — M25562 Pain in left knee: Secondary | ICD-10-CM | POA: Diagnosis not present

## 2019-05-01 DIAGNOSIS — M6281 Muscle weakness (generalized): Secondary | ICD-10-CM | POA: Diagnosis not present

## 2019-05-01 DIAGNOSIS — R262 Difficulty in walking, not elsewhere classified: Secondary | ICD-10-CM | POA: Diagnosis not present

## 2019-05-04 DIAGNOSIS — M6281 Muscle weakness (generalized): Secondary | ICD-10-CM | POA: Diagnosis not present

## 2019-05-04 DIAGNOSIS — R262 Difficulty in walking, not elsewhere classified: Secondary | ICD-10-CM | POA: Diagnosis not present

## 2019-05-04 DIAGNOSIS — M25562 Pain in left knee: Secondary | ICD-10-CM | POA: Diagnosis not present

## 2019-05-07 DIAGNOSIS — M25562 Pain in left knee: Secondary | ICD-10-CM | POA: Diagnosis not present

## 2019-05-07 DIAGNOSIS — M6281 Muscle weakness (generalized): Secondary | ICD-10-CM | POA: Diagnosis not present

## 2019-05-07 DIAGNOSIS — R262 Difficulty in walking, not elsewhere classified: Secondary | ICD-10-CM | POA: Diagnosis not present

## 2019-05-17 DIAGNOSIS — R262 Difficulty in walking, not elsewhere classified: Secondary | ICD-10-CM | POA: Diagnosis not present

## 2019-05-17 DIAGNOSIS — M25562 Pain in left knee: Secondary | ICD-10-CM | POA: Diagnosis not present

## 2019-05-17 DIAGNOSIS — M6281 Muscle weakness (generalized): Secondary | ICD-10-CM | POA: Diagnosis not present

## 2019-06-18 DIAGNOSIS — M25562 Pain in left knee: Secondary | ICD-10-CM | POA: Diagnosis not present

## 2019-07-04 DIAGNOSIS — X58XXXA Exposure to other specified factors, initial encounter: Secondary | ICD-10-CM | POA: Diagnosis not present

## 2019-07-04 DIAGNOSIS — X503XXA Overexertion from repetitive movements, initial encounter: Secondary | ICD-10-CM | POA: Diagnosis not present

## 2019-07-04 DIAGNOSIS — M94262 Chondromalacia, left knee: Secondary | ICD-10-CM | POA: Diagnosis not present

## 2019-07-04 DIAGNOSIS — S83242A Other tear of medial meniscus, current injury, left knee, initial encounter: Secondary | ICD-10-CM | POA: Diagnosis not present

## 2019-07-04 DIAGNOSIS — S83282A Other tear of lateral meniscus, current injury, left knee, initial encounter: Secondary | ICD-10-CM | POA: Diagnosis not present

## 2019-07-04 DIAGNOSIS — G8918 Other acute postprocedural pain: Secondary | ICD-10-CM | POA: Diagnosis not present

## 2019-07-17 DIAGNOSIS — M25562 Pain in left knee: Secondary | ICD-10-CM | POA: Diagnosis not present

## 2019-07-17 DIAGNOSIS — M6281 Muscle weakness (generalized): Secondary | ICD-10-CM | POA: Diagnosis not present

## 2019-07-24 DIAGNOSIS — M25562 Pain in left knee: Secondary | ICD-10-CM | POA: Diagnosis not present

## 2019-07-24 DIAGNOSIS — M6281 Muscle weakness (generalized): Secondary | ICD-10-CM | POA: Diagnosis not present

## 2019-09-06 DIAGNOSIS — M6281 Muscle weakness (generalized): Secondary | ICD-10-CM | POA: Diagnosis not present

## 2019-09-06 DIAGNOSIS — M25562 Pain in left knee: Secondary | ICD-10-CM | POA: Diagnosis not present

## 2019-10-23 DIAGNOSIS — S83241A Other tear of medial meniscus, current injury, right knee, initial encounter: Secondary | ICD-10-CM | POA: Diagnosis not present

## 2019-11-16 ENCOUNTER — Other Ambulatory Visit: Payer: Self-pay | Admitting: Family Medicine

## 2019-11-16 DIAGNOSIS — Z1231 Encounter for screening mammogram for malignant neoplasm of breast: Secondary | ICD-10-CM

## 2019-12-04 DIAGNOSIS — I10 Essential (primary) hypertension: Secondary | ICD-10-CM | POA: Diagnosis not present

## 2019-12-04 DIAGNOSIS — Z0000 Encounter for general adult medical examination without abnormal findings: Secondary | ICD-10-CM | POA: Diagnosis not present

## 2019-12-27 ENCOUNTER — Ambulatory Visit: Payer: BC Managed Care – PPO

## 2019-12-31 ENCOUNTER — Ambulatory Visit
Admission: RE | Admit: 2019-12-31 | Discharge: 2019-12-31 | Disposition: A | Discharge status: Home / Self Care | Payer: BC Managed Care – PPO | Source: Ambulatory Visit | Attending: Family Medicine | Admitting: Family Medicine

## 2019-12-31 ENCOUNTER — Other Ambulatory Visit: Payer: Self-pay

## 2019-12-31 DIAGNOSIS — Z1231 Encounter for screening mammogram for malignant neoplasm of breast: Secondary | ICD-10-CM | POA: Diagnosis not present

## 2020-01-14 DIAGNOSIS — R109 Unspecified abdominal pain: Secondary | ICD-10-CM | POA: Diagnosis not present

## 2020-03-06 DIAGNOSIS — S83241D Other tear of medial meniscus, current injury, right knee, subsequent encounter: Secondary | ICD-10-CM | POA: Diagnosis not present

## 2020-03-06 DIAGNOSIS — M7521 Bicipital tendinitis, right shoulder: Secondary | ICD-10-CM | POA: Diagnosis not present

## 2020-03-11 DIAGNOSIS — M25561 Pain in right knee: Secondary | ICD-10-CM | POA: Diagnosis not present

## 2020-04-01 DIAGNOSIS — H5203 Hypermetropia, bilateral: Secondary | ICD-10-CM | POA: Diagnosis not present

## 2020-05-02 DIAGNOSIS — S83241A Other tear of medial meniscus, current injury, right knee, initial encounter: Secondary | ICD-10-CM | POA: Diagnosis not present

## 2020-05-02 DIAGNOSIS — S83281A Other tear of lateral meniscus, current injury, right knee, initial encounter: Secondary | ICD-10-CM | POA: Diagnosis not present

## 2020-05-02 DIAGNOSIS — M94261 Chondromalacia, right knee: Secondary | ICD-10-CM | POA: Diagnosis not present

## 2020-05-02 DIAGNOSIS — G8918 Other acute postprocedural pain: Secondary | ICD-10-CM | POA: Diagnosis not present

## 2020-05-02 DIAGNOSIS — M1711 Unilateral primary osteoarthritis, right knee: Secondary | ICD-10-CM | POA: Diagnosis not present

## 2020-05-12 DIAGNOSIS — S83281D Other tear of lateral meniscus, current injury, right knee, subsequent encounter: Secondary | ICD-10-CM | POA: Diagnosis not present

## 2020-05-12 DIAGNOSIS — S83241D Other tear of medial meniscus, current injury, right knee, subsequent encounter: Secondary | ICD-10-CM | POA: Diagnosis not present

## 2020-05-13 ENCOUNTER — Ambulatory Visit (HOSPITAL_COMMUNITY)
Admission: RE | Admit: 2020-05-13 | Discharge: 2020-05-13 | Disposition: A | Discharge status: Home / Self Care | Payer: BC Managed Care – PPO | Source: Ambulatory Visit | Attending: Orthopedic Surgery | Admitting: Orthopedic Surgery

## 2020-05-13 ENCOUNTER — Other Ambulatory Visit (HOSPITAL_COMMUNITY): Payer: Self-pay | Admitting: Physician Assistant

## 2020-05-13 ENCOUNTER — Other Ambulatory Visit: Payer: Self-pay

## 2020-05-13 ENCOUNTER — Other Ambulatory Visit (HOSPITAL_COMMUNITY): Payer: Self-pay | Admitting: Orthopedic Surgery

## 2020-05-13 DIAGNOSIS — M7989 Other specified soft tissue disorders: Secondary | ICD-10-CM | POA: Insufficient documentation

## 2020-05-30 DIAGNOSIS — S83241D Other tear of medial meniscus, current injury, right knee, subsequent encounter: Secondary | ICD-10-CM | POA: Diagnosis not present

## 2020-07-04 DIAGNOSIS — Z4789 Encounter for other orthopedic aftercare: Secondary | ICD-10-CM | POA: Diagnosis not present

## 2020-07-04 DIAGNOSIS — M25561 Pain in right knee: Secondary | ICD-10-CM | POA: Diagnosis not present

## 2020-07-08 DIAGNOSIS — Z4789 Encounter for other orthopedic aftercare: Secondary | ICD-10-CM | POA: Diagnosis not present

## 2020-07-08 DIAGNOSIS — M25561 Pain in right knee: Secondary | ICD-10-CM | POA: Diagnosis not present

## 2020-07-10 DIAGNOSIS — Z4789 Encounter for other orthopedic aftercare: Secondary | ICD-10-CM | POA: Diagnosis not present

## 2020-07-10 DIAGNOSIS — M25561 Pain in right knee: Secondary | ICD-10-CM | POA: Diagnosis not present

## 2020-07-22 DIAGNOSIS — M25561 Pain in right knee: Secondary | ICD-10-CM | POA: Diagnosis not present

## 2020-08-20 DIAGNOSIS — M549 Dorsalgia, unspecified: Secondary | ICD-10-CM | POA: Diagnosis not present

## 2020-11-19 DIAGNOSIS — S60459A Superficial foreign body of unspecified finger, initial encounter: Secondary | ICD-10-CM | POA: Diagnosis not present

## 2020-11-21 ENCOUNTER — Other Ambulatory Visit: Payer: Self-pay | Admitting: Family Medicine

## 2020-11-21 DIAGNOSIS — Z1231 Encounter for screening mammogram for malignant neoplasm of breast: Secondary | ICD-10-CM

## 2020-12-11 DIAGNOSIS — I10 Essential (primary) hypertension: Secondary | ICD-10-CM | POA: Diagnosis not present

## 2020-12-11 DIAGNOSIS — Z124 Encounter for screening for malignant neoplasm of cervix: Secondary | ICD-10-CM | POA: Diagnosis not present

## 2020-12-11 DIAGNOSIS — Z0000 Encounter for general adult medical examination without abnormal findings: Secondary | ICD-10-CM | POA: Diagnosis not present

## 2020-12-28 DIAGNOSIS — M25561 Pain in right knee: Secondary | ICD-10-CM | POA: Diagnosis not present

## 2021-01-16 ENCOUNTER — Inpatient Hospital Stay: Admission: RE | Admit: 2021-01-16 | Payer: BC Managed Care – PPO | Source: Ambulatory Visit

## 2021-01-27 ENCOUNTER — Ambulatory Visit
Admission: RE | Admit: 2021-01-27 | Discharge: 2021-01-27 | Disposition: A | Discharge status: Home / Self Care | Payer: BC Managed Care – PPO | Source: Ambulatory Visit | Attending: Family Medicine | Admitting: Family Medicine

## 2021-01-27 ENCOUNTER — Other Ambulatory Visit: Payer: Self-pay

## 2021-01-27 DIAGNOSIS — Z1231 Encounter for screening mammogram for malignant neoplasm of breast: Secondary | ICD-10-CM | POA: Diagnosis not present

## 2021-03-01 DIAGNOSIS — B349 Viral infection, unspecified: Secondary | ICD-10-CM | POA: Diagnosis not present

## 2021-03-01 DIAGNOSIS — R270 Ataxia, unspecified: Secondary | ICD-10-CM | POA: Diagnosis not present

## 2021-03-02 DIAGNOSIS — U071 COVID-19: Secondary | ICD-10-CM | POA: Diagnosis not present

## 2021-03-02 DIAGNOSIS — R42 Dizziness and giddiness: Secondary | ICD-10-CM | POA: Diagnosis not present

## 2021-03-02 DIAGNOSIS — R059 Cough, unspecified: Secondary | ICD-10-CM | POA: Diagnosis not present

## 2021-03-10 DIAGNOSIS — G933 Postviral fatigue syndrome: Secondary | ICD-10-CM | POA: Diagnosis not present

## 2021-03-10 DIAGNOSIS — I10 Essential (primary) hypertension: Secondary | ICD-10-CM | POA: Diagnosis not present

## 2021-03-10 DIAGNOSIS — R059 Cough, unspecified: Secondary | ICD-10-CM | POA: Diagnosis not present

## 2021-03-25 DIAGNOSIS — I10 Essential (primary) hypertension: Secondary | ICD-10-CM | POA: Diagnosis not present

## 2021-06-02 DIAGNOSIS — H00025 Hordeolum internum left lower eyelid: Secondary | ICD-10-CM | POA: Diagnosis not present

## 2021-07-24 DIAGNOSIS — F4320 Adjustment disorder, unspecified: Secondary | ICD-10-CM | POA: Diagnosis not present

## 2021-10-12 DIAGNOSIS — L732 Hidradenitis suppurativa: Secondary | ICD-10-CM | POA: Diagnosis not present

## 2021-10-12 DIAGNOSIS — L309 Dermatitis, unspecified: Secondary | ICD-10-CM | POA: Diagnosis not present

## 2021-10-15 DIAGNOSIS — F4320 Adjustment disorder, unspecified: Secondary | ICD-10-CM | POA: Diagnosis not present

## 2021-11-25 DIAGNOSIS — F4320 Adjustment disorder, unspecified: Secondary | ICD-10-CM | POA: Diagnosis not present

## 2021-12-17 DIAGNOSIS — I10 Essential (primary) hypertension: Secondary | ICD-10-CM | POA: Diagnosis not present

## 2021-12-17 DIAGNOSIS — Z0000 Encounter for general adult medical examination without abnormal findings: Secondary | ICD-10-CM | POA: Diagnosis not present

## 2021-12-29 ENCOUNTER — Other Ambulatory Visit: Payer: Self-pay | Admitting: Family Medicine

## 2021-12-29 DIAGNOSIS — Z1231 Encounter for screening mammogram for malignant neoplasm of breast: Secondary | ICD-10-CM

## 2022-01-29 ENCOUNTER — Ambulatory Visit
Admission: RE | Admit: 2022-01-29 | Discharge: 2022-01-29 | Disposition: A | Discharge status: Home / Self Care | Payer: BC Managed Care – PPO | Source: Ambulatory Visit | Attending: Family Medicine | Admitting: Family Medicine

## 2022-01-29 DIAGNOSIS — Z1231 Encounter for screening mammogram for malignant neoplasm of breast: Secondary | ICD-10-CM | POA: Diagnosis not present

## 2022-02-05 ENCOUNTER — Institutional Professional Consult (permissible substitution): Payer: BC Managed Care – PPO | Admitting: Plastic Surgery

## 2022-03-11 ENCOUNTER — Other Ambulatory Visit: Payer: Self-pay

## 2022-03-11 DIAGNOSIS — M25511 Pain in right shoulder: Secondary | ICD-10-CM | POA: Diagnosis not present

## 2022-03-11 MED ORDER — PREDNISONE 20 MG PO TABS
ORAL_TABLET | ORAL | 0 refills | Status: AC
  Filled 2022-03-11: qty 18, 9d supply, fill #0

## 2022-04-26 DIAGNOSIS — M7501 Adhesive capsulitis of right shoulder: Secondary | ICD-10-CM | POA: Diagnosis not present

## 2022-04-26 DIAGNOSIS — M25511 Pain in right shoulder: Secondary | ICD-10-CM | POA: Diagnosis not present

## 2022-05-12 ENCOUNTER — Other Ambulatory Visit: Payer: Self-pay

## 2022-05-13 DIAGNOSIS — M9902 Segmental and somatic dysfunction of thoracic region: Secondary | ICD-10-CM | POA: Diagnosis not present

## 2022-05-13 DIAGNOSIS — M9907 Segmental and somatic dysfunction of upper extremity: Secondary | ICD-10-CM | POA: Diagnosis not present

## 2022-05-13 DIAGNOSIS — M7501 Adhesive capsulitis of right shoulder: Secondary | ICD-10-CM | POA: Diagnosis not present

## 2022-05-13 DIAGNOSIS — M25511 Pain in right shoulder: Secondary | ICD-10-CM | POA: Diagnosis not present

## 2022-05-20 DIAGNOSIS — M2550 Pain in unspecified joint: Secondary | ICD-10-CM | POA: Diagnosis not present

## 2022-05-20 DIAGNOSIS — R140 Abdominal distension (gaseous): Secondary | ICD-10-CM | POA: Diagnosis not present

## 2022-05-20 DIAGNOSIS — K921 Melena: Secondary | ICD-10-CM | POA: Diagnosis not present

## 2022-05-26 DIAGNOSIS — K921 Melena: Secondary | ICD-10-CM | POA: Diagnosis not present

## 2022-06-24 DIAGNOSIS — M25562 Pain in left knee: Secondary | ICD-10-CM | POA: Diagnosis not present

## 2022-06-24 DIAGNOSIS — M25551 Pain in right hip: Secondary | ICD-10-CM | POA: Diagnosis not present

## 2022-06-24 DIAGNOSIS — M9906 Segmental and somatic dysfunction of lower extremity: Secondary | ICD-10-CM | POA: Diagnosis not present

## 2022-06-24 DIAGNOSIS — M25561 Pain in right knee: Secondary | ICD-10-CM | POA: Diagnosis not present

## 2022-06-28 IMAGING — MG MM DIGITAL SCREENING BILAT W/ TOMO AND CAD
8 series · 9 of 24 positions shown · non-contrast
Comparison: Previous exam(s).

CLINICAL DATA: Screening.

EXAM:
DIGITAL SCREENING BILATERAL MAMMOGRAM WITH TOMOSYNTHESIS AND CAD
TECHNIQUE: Bilateral screening digital craniocaudal and mediolateral oblique
mammograms were obtained. Bilateral screening digital breast
tomosynthesis was performed. The images were evaluated with
computer-aided detection.

[L CC synth-2D]
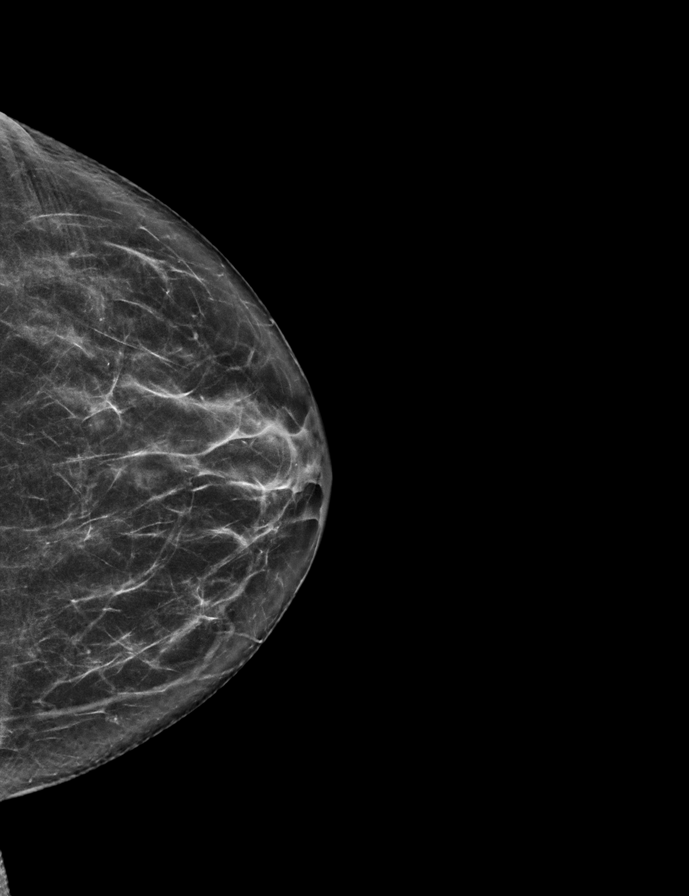

[R CC synth-2D]
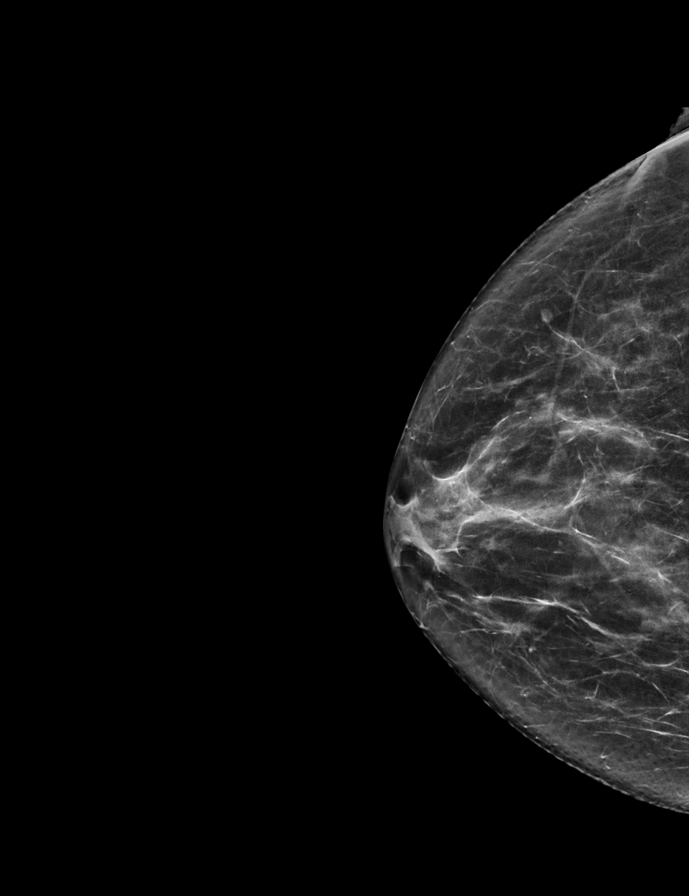

[L MLO synth-2D]
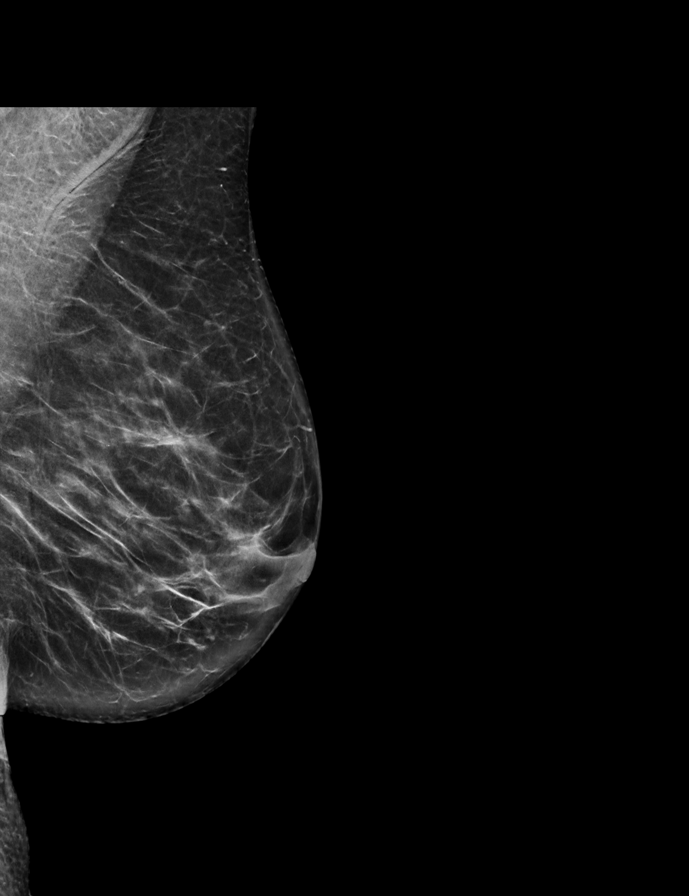

[R MLO synth-2D]
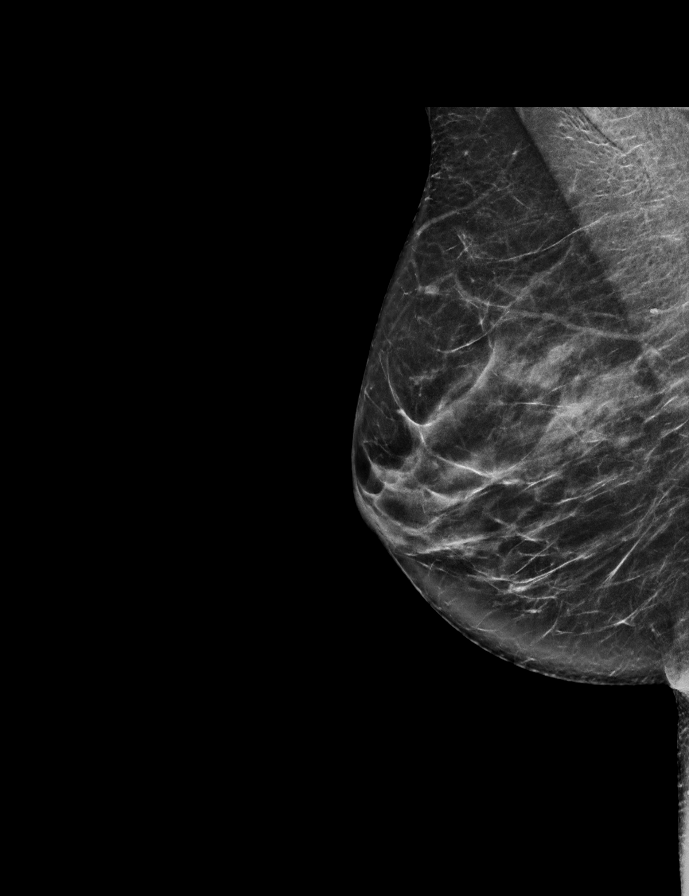

[L CC tomo · 2 of 63 frames shown]
[frame 21/63]
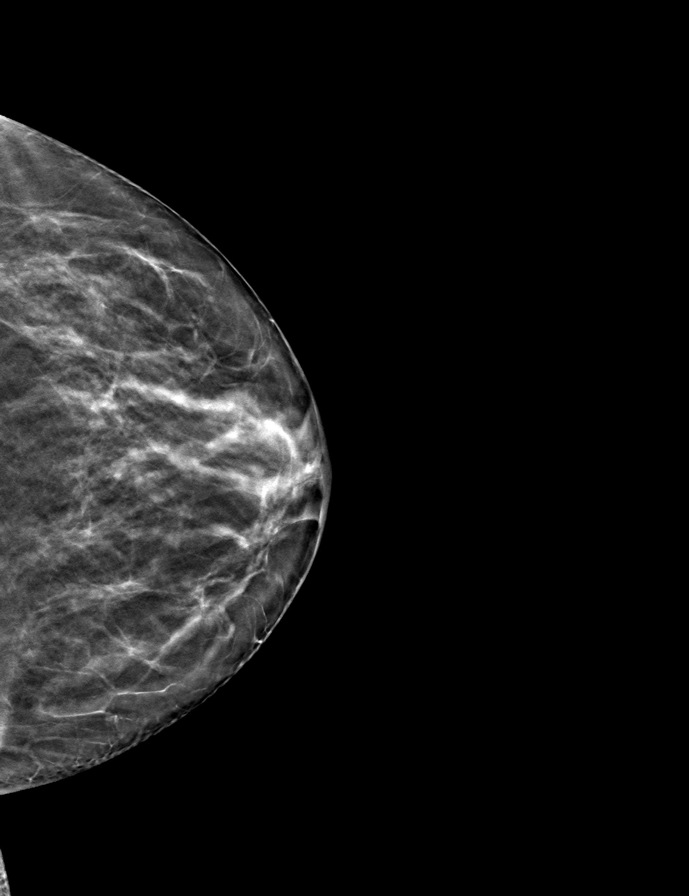
[frame 32/63]
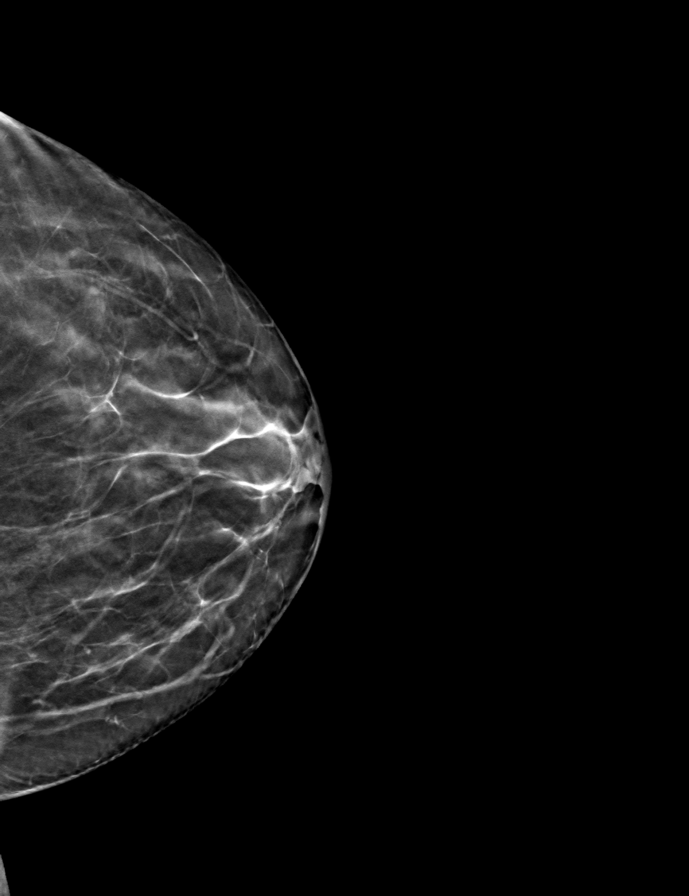

[R MLO tomo · tomo slice 33/65.0]
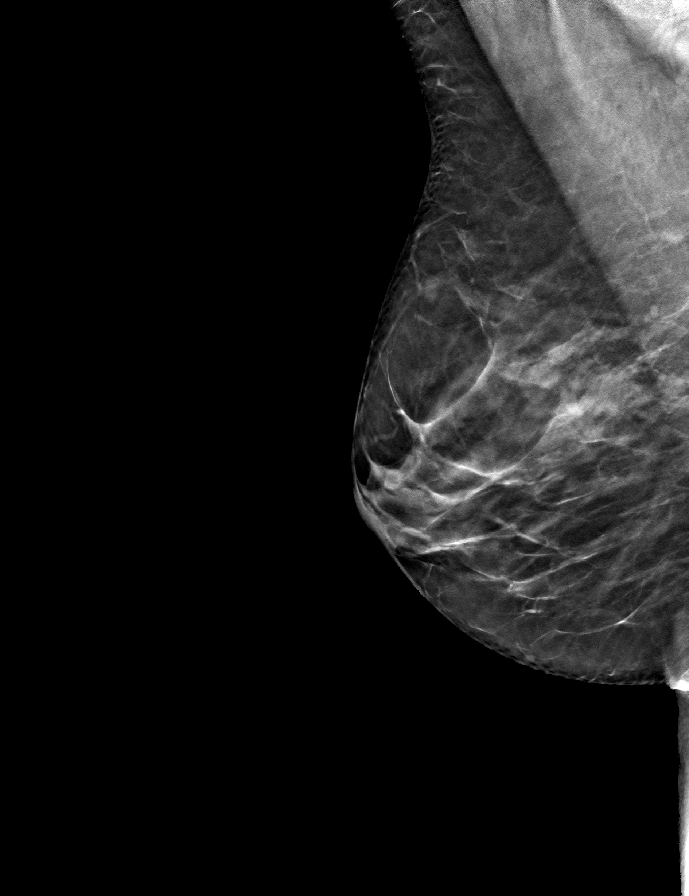

[R CC tomo · tomo slice 30/59.0]
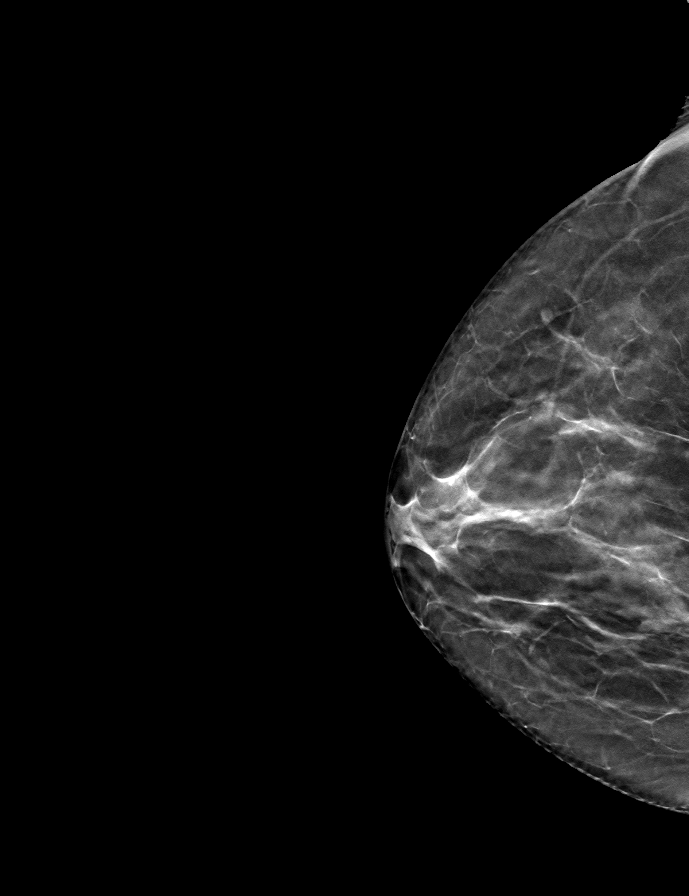

[L MLO tomo · tomo slice 35/69.0]
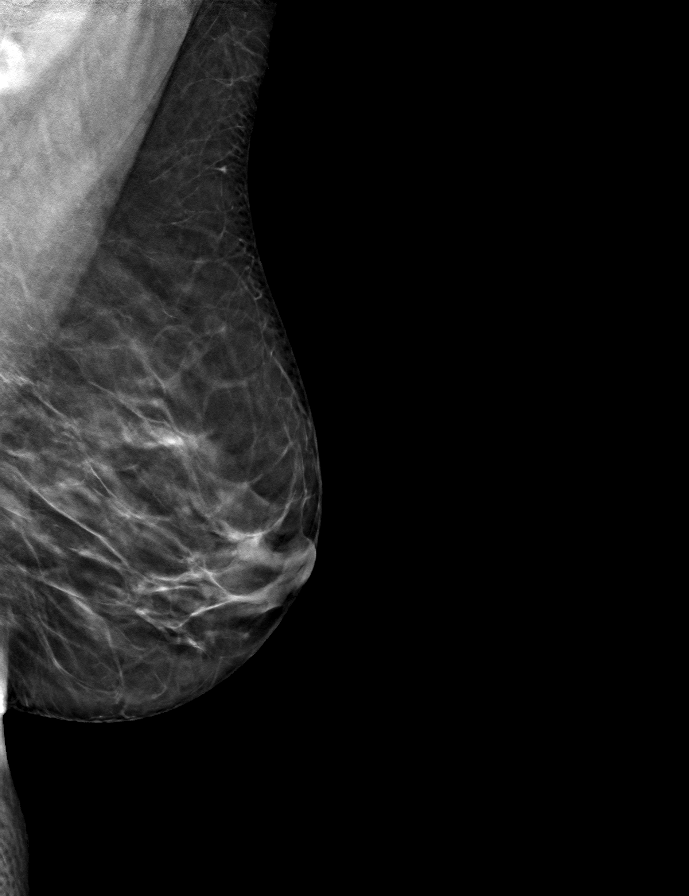

[9 of 24 positions shown; findings below may reference images not displayed]

ACR Breast Density Category c: The breast tissue is heterogeneously
dense, which may obscure small masses.
FINDINGS: There are no findings suspicious for malignancy.
IMPRESSION: No mammographic evidence of malignancy. A result letter of this
screening mammogram will be mailed directly to the patient.

RECOMMENDATION:
Screening mammogram in one year. (Code:Q3-W-BC3)

BI-RADS CATEGORY  1: Negative.

## 2022-07-26 DIAGNOSIS — R0981 Nasal congestion: Secondary | ICD-10-CM | POA: Diagnosis not present

## 2022-08-19 DIAGNOSIS — R29898 Other symptoms and signs involving the musculoskeletal system: Secondary | ICD-10-CM | POA: Diagnosis not present

## 2022-08-19 DIAGNOSIS — M7501 Adhesive capsulitis of right shoulder: Secondary | ICD-10-CM | POA: Diagnosis not present

## 2022-08-19 DIAGNOSIS — M9907 Segmental and somatic dysfunction of upper extremity: Secondary | ICD-10-CM | POA: Diagnosis not present

## 2022-08-19 DIAGNOSIS — M25511 Pain in right shoulder: Secondary | ICD-10-CM | POA: Diagnosis not present

## 2022-09-02 ENCOUNTER — Encounter: Payer: Self-pay | Admitting: Plastic Surgery

## 2022-09-02 ENCOUNTER — Ambulatory Visit (INDEPENDENT_AMBULATORY_CARE_PROVIDER_SITE_OTHER): Payer: Self-pay | Admitting: Plastic Surgery

## 2022-09-02 VITALS — BP 127/85 | HR 94 | Ht 63.0 in | Wt 140.4 lb

## 2022-09-02 DIAGNOSIS — L578 Other skin changes due to chronic exposure to nonionizing radiation: Secondary | ICD-10-CM

## 2022-09-02 NOTE — Progress Notes (Signed)
   Referring Provider Alroy Dust, L.Marlou Sa, [address] [employer] [address] [place],  [place] [address]   CC:  Chief Complaint  Patient presents with   Advice Only      Amanda Huber is an 57 y.o. female.  HPI: Amanda Huber is a very nice 57 year old female who presents today for evaluation of her skin.  The skin has extensive sun damage with a " crpe look".  She is requesting information on what can be done about this.  No Known Allergies  Outpatient Encounter Medications as of 09/02/2022  Medication Sig   Calcium 200 MG TABS 1 tablet Orally once a day   diclofenac sodium (VOLTAREN) 1 % GEL Apply 2 g topically 4 (four) times daily. Rub into affected area of foot 2 to 4 times daily   Levonorg-Eth Estrad Triphasic (TRIVORA, 28, PO) Take by mouth daily.   lisinopril (ZESTRIL) 10 MG tablet 1 tablet by mouth Once a day   Magnesium 500 MG CAPS 1 tablet with a meal Orally Once a day   predniSONE (DELTASONE) 20 MG tablet Take 3 tablets by mouth daily for 3 days, take 2 tablets daily   for 3 days, then take 1 tablet daily for 3 days   meloxicam (MOBIC) 15 MG tablet Take 1 tablet (15 mg total) by mouth daily as needed for pain. (Patient not taking: Reported on 12/02/2017)   No facility-administered encounter medications on file as of 09/02/2022.     No past medical history on file.  No past surgical history on file.  No family history on file.  Social History   Social History Narrative   Not on file     Review of Systems General: Denies fevers, chills, weight loss CV: Denies chest pain, shortness of breath, palpitations Skin: Extensive loose sun damage skin  Physical Exam    09/02/2022    2:21 PM 01/14/2013    7:06 PM  Vitals with BMI  Height 5\' 3"    Weight 140 lbs 6 oz   BMI [id number]   Systolic [id number]  Diastolic 85 88  Pulse 94 73    General:  No acute distress,  Alert and oriented, Non-Toxic, Normal speech and affect Integument: As noted extensive sun damage skin on the arms and  thighs. Mammogram: Not applicable Assessment/Plan Sun damage skin with extensive rhytids: Discussed at length options for improving the quality of the skin including increased hydration increased protein intake increased vitamin C and zinc use.  I discussed with her the importance of sunscreen use and moisturizing cream both in the morning and at night preferably with the addition of hyaluronic acid.  We discussed the possible addition of collagen supplements to her diet.  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  We did discuss nonsurgical skin treatments as well.  We do not offer any of these at this time but there are practices in town which she can contact.  Follow-up with me as desired.  Camillia Herter 09/02/2022, 3:19 PM

## 2022-09-06 DIAGNOSIS — R350 Frequency of micturition: Secondary | ICD-10-CM | POA: Diagnosis not present

## 2022-09-07 DIAGNOSIS — F4320 Adjustment disorder, unspecified: Secondary | ICD-10-CM | POA: Diagnosis not present

## 2022-09-20 ENCOUNTER — Encounter (INDEPENDENT_AMBULATORY_CARE_PROVIDER_SITE_OTHER): Payer: Self-pay

## 2022-09-20 DIAGNOSIS — Z719 Counseling, unspecified: Secondary | ICD-10-CM

## 2022-10-21 DIAGNOSIS — F4320 Adjustment disorder, unspecified: Secondary | ICD-10-CM | POA: Diagnosis not present

## 2022-10-22 DIAGNOSIS — L853 Xerosis cutis: Secondary | ICD-10-CM | POA: Diagnosis not present

## 2022-11-04 DIAGNOSIS — R319 Hematuria, unspecified: Secondary | ICD-10-CM | POA: Diagnosis not present

## 2022-11-18 DIAGNOSIS — R8290 Unspecified abnormal findings in urine: Secondary | ICD-10-CM | POA: Diagnosis not present

## 2022-12-07 DIAGNOSIS — R8290 Unspecified abnormal findings in urine: Secondary | ICD-10-CM | POA: Diagnosis not present

## 2022-12-12 DIAGNOSIS — N390 Urinary tract infection, site not specified: Secondary | ICD-10-CM | POA: Diagnosis not present

## 2022-12-21 DIAGNOSIS — Z124 Encounter for screening for malignant neoplasm of cervix: Secondary | ICD-10-CM | POA: Diagnosis not present

## 2022-12-21 DIAGNOSIS — Z23 Encounter for immunization: Secondary | ICD-10-CM | POA: Diagnosis not present

## 2022-12-21 DIAGNOSIS — I10 Essential (primary) hypertension: Secondary | ICD-10-CM | POA: Diagnosis not present

## 2022-12-21 DIAGNOSIS — N390 Urinary tract infection, site not specified: Secondary | ICD-10-CM | POA: Diagnosis not present

## 2022-12-21 DIAGNOSIS — Z13220 Encounter for screening for lipoid disorders: Secondary | ICD-10-CM | POA: Diagnosis not present

## 2022-12-21 DIAGNOSIS — Z1239 Encounter for other screening for malignant neoplasm of breast: Secondary | ICD-10-CM | POA: Diagnosis not present

## 2022-12-21 DIAGNOSIS — Z1211 Encounter for screening for malignant neoplasm of colon: Secondary | ICD-10-CM | POA: Diagnosis not present

## 2022-12-21 DIAGNOSIS — Z0189 Encounter for other specified special examinations: Secondary | ICD-10-CM | POA: Diagnosis not present

## 2023-01-04 ENCOUNTER — Other Ambulatory Visit: Payer: Self-pay | Admitting: Family Medicine

## 2023-01-04 DIAGNOSIS — Z1231 Encounter for screening mammogram for malignant neoplasm of breast: Secondary | ICD-10-CM

## 2023-01-28 DIAGNOSIS — R300 Dysuria: Secondary | ICD-10-CM | POA: Diagnosis not present

## 2023-02-01 ENCOUNTER — Ambulatory Visit
Admission: RE | Admit: 2023-02-01 | Discharge: 2023-02-01 | Disposition: A | Discharge status: Home / Self Care | Payer: BC Managed Care – PPO | Source: Ambulatory Visit | Attending: Family Medicine | Admitting: Family Medicine

## 2023-02-01 DIAGNOSIS — Z1231 Encounter for screening mammogram for malignant neoplasm of breast: Secondary | ICD-10-CM | POA: Diagnosis not present

## 2023-02-15 DIAGNOSIS — N390 Urinary tract infection, site not specified: Secondary | ICD-10-CM | POA: Diagnosis not present

## 2023-02-15 DIAGNOSIS — L739 Follicular disorder, unspecified: Secondary | ICD-10-CM | POA: Diagnosis not present

## 2023-02-28 DIAGNOSIS — R310 Gross hematuria: Secondary | ICD-10-CM | POA: Diagnosis not present

## 2023-03-09 DIAGNOSIS — R310 Gross hematuria: Secondary | ICD-10-CM | POA: Diagnosis not present

## 2023-03-09 DIAGNOSIS — I700 Atherosclerosis of aorta: Secondary | ICD-10-CM | POA: Diagnosis not present

## 2023-03-09 DIAGNOSIS — R319 Hematuria, unspecified: Secondary | ICD-10-CM | POA: Diagnosis not present

## 2023-03-31 ENCOUNTER — Other Ambulatory Visit: Payer: Self-pay | Admitting: Urology

## 2023-03-31 DIAGNOSIS — R932 Abnormal findings on diagnostic imaging of liver and biliary tract: Secondary | ICD-10-CM

## 2023-04-05 ENCOUNTER — Other Ambulatory Visit: Payer: Self-pay | Admitting: Urology

## 2023-04-05 DIAGNOSIS — R932 Abnormal findings on diagnostic imaging of liver and biliary tract: Secondary | ICD-10-CM

## 2023-04-05 DIAGNOSIS — K769 Liver disease, unspecified: Secondary | ICD-10-CM

## 2023-04-27 DIAGNOSIS — N3020 Other chronic cystitis without hematuria: Secondary | ICD-10-CM | POA: Diagnosis not present

## 2023-04-27 DIAGNOSIS — B961 Klebsiella pneumoniae [K. pneumoniae] as the cause of diseases classified elsewhere: Secondary | ICD-10-CM | POA: Diagnosis not present

## 2023-04-27 DIAGNOSIS — N390 Urinary tract infection, site not specified: Secondary | ICD-10-CM | POA: Diagnosis not present

## 2023-04-27 DIAGNOSIS — R310 Gross hematuria: Secondary | ICD-10-CM | POA: Diagnosis not present

## 2023-05-14 ENCOUNTER — Ambulatory Visit
Admission: RE | Admit: 2023-05-14 | Discharge: 2023-05-14 | Disposition: A | Discharge status: Home / Self Care | Payer: BC Managed Care – PPO | Source: Ambulatory Visit | Attending: Urology | Admitting: Urology

## 2023-05-14 DIAGNOSIS — I700 Atherosclerosis of aorta: Secondary | ICD-10-CM | POA: Diagnosis not present

## 2023-05-14 DIAGNOSIS — K769 Liver disease, unspecified: Secondary | ICD-10-CM

## 2023-05-14 DIAGNOSIS — R932 Abnormal findings on diagnostic imaging of liver and biliary tract: Secondary | ICD-10-CM

## 2023-05-14 MED ORDER — GADOPICLENOL 0.5 MMOL/ML IV SOLN
6.0000 mL | Freq: Once | INTRAVENOUS | Status: AC | PRN
  Administered 2023-05-14: 6 mL via INTRAVENOUS

## 2023-06-16 DIAGNOSIS — N390 Urinary tract infection, site not specified: Secondary | ICD-10-CM | POA: Diagnosis not present

## 2023-06-16 DIAGNOSIS — N3020 Other chronic cystitis without hematuria: Secondary | ICD-10-CM | POA: Diagnosis not present

## 2023-06-16 DIAGNOSIS — R310 Gross hematuria: Secondary | ICD-10-CM | POA: Diagnosis not present

## 2023-06-16 DIAGNOSIS — B951 Streptococcus, group B, as the cause of diseases classified elsewhere: Secondary | ICD-10-CM | POA: Diagnosis not present

## 2023-07-11 DIAGNOSIS — M25562 Pain in left knee: Secondary | ICD-10-CM | POA: Diagnosis not present

## 2023-08-01 DIAGNOSIS — N3020 Other chronic cystitis without hematuria: Secondary | ICD-10-CM | POA: Diagnosis not present

## 2023-08-10 DIAGNOSIS — F4320 Adjustment disorder, unspecified: Secondary | ICD-10-CM | POA: Diagnosis not present

## 2023-08-24 DIAGNOSIS — R110 Nausea: Secondary | ICD-10-CM | POA: Diagnosis not present

## 2023-08-24 DIAGNOSIS — R10816 Epigastric abdominal tenderness: Secondary | ICD-10-CM | POA: Diagnosis not present

## 2023-08-29 ENCOUNTER — Other Ambulatory Visit: Payer: Self-pay | Admitting: Physician Assistant

## 2023-08-29 DIAGNOSIS — R110 Nausea: Secondary | ICD-10-CM

## 2023-08-29 DIAGNOSIS — R10816 Epigastric abdominal tenderness: Secondary | ICD-10-CM

## 2023-09-02 ENCOUNTER — Other Ambulatory Visit

## 2023-11-03 DIAGNOSIS — F4320 Adjustment disorder, unspecified: Secondary | ICD-10-CM | POA: Diagnosis not present

## 2023-11-17 DIAGNOSIS — M25562 Pain in left knee: Secondary | ICD-10-CM | POA: Diagnosis not present

## 2023-11-17 DIAGNOSIS — M25561 Pain in right knee: Secondary | ICD-10-CM | POA: Diagnosis not present

## 2023-12-19 DIAGNOSIS — R8290 Unspecified abnormal findings in urine: Secondary | ICD-10-CM | POA: Diagnosis not present

## 2023-12-19 DIAGNOSIS — N390 Urinary tract infection, site not specified: Secondary | ICD-10-CM | POA: Diagnosis not present

## 2023-12-19 DIAGNOSIS — J988 Other specified respiratory disorders: Secondary | ICD-10-CM | POA: Diagnosis not present

## 2023-12-19 DIAGNOSIS — Z13220 Encounter for screening for lipoid disorders: Secondary | ICD-10-CM | POA: Diagnosis not present

## 2023-12-19 DIAGNOSIS — D649 Anemia, unspecified: Secondary | ICD-10-CM | POA: Diagnosis not present

## 2023-12-19 DIAGNOSIS — I10 Essential (primary) hypertension: Secondary | ICD-10-CM | POA: Diagnosis not present

## 2023-12-22 ENCOUNTER — Other Ambulatory Visit: Payer: Self-pay | Admitting: Family Medicine

## 2023-12-22 DIAGNOSIS — M2550 Pain in unspecified joint: Secondary | ICD-10-CM | POA: Diagnosis not present

## 2023-12-22 DIAGNOSIS — Z0189 Encounter for other specified special examinations: Secondary | ICD-10-CM | POA: Diagnosis not present

## 2023-12-22 DIAGNOSIS — I10 Essential (primary) hypertension: Secondary | ICD-10-CM | POA: Diagnosis not present

## 2023-12-22 DIAGNOSIS — J988 Other specified respiratory disorders: Secondary | ICD-10-CM | POA: Diagnosis not present

## 2023-12-22 DIAGNOSIS — Z1231 Encounter for screening mammogram for malignant neoplasm of breast: Secondary | ICD-10-CM

## 2023-12-22 DIAGNOSIS — Z13220 Encounter for screening for lipoid disorders: Secondary | ICD-10-CM | POA: Diagnosis not present

## 2023-12-28 DIAGNOSIS — F4320 Adjustment disorder, unspecified: Secondary | ICD-10-CM | POA: Diagnosis not present

## 2024-02-02 ENCOUNTER — Ambulatory Visit
Admission: RE | Admit: 2024-02-02 | Discharge: 2024-02-02 | Disposition: A | Discharge status: Home / Self Care | Source: Ambulatory Visit | Attending: Family Medicine | Admitting: Family Medicine

## 2024-02-02 DIAGNOSIS — I10 Essential (primary) hypertension: Secondary | ICD-10-CM | POA: Diagnosis not present

## 2024-02-02 DIAGNOSIS — Z1231 Encounter for screening mammogram for malignant neoplasm of breast: Secondary | ICD-10-CM

## 2024-02-02 DIAGNOSIS — R0981 Nasal congestion: Secondary | ICD-10-CM | POA: Diagnosis not present

## 2024-02-02 DIAGNOSIS — R059 Cough, unspecified: Secondary | ICD-10-CM | POA: Diagnosis not present

## 2024-04-10 DIAGNOSIS — R799 Abnormal finding of blood chemistry, unspecified: Secondary | ICD-10-CM | POA: Diagnosis not present

## 2024-04-10 DIAGNOSIS — R718 Other abnormality of red blood cells: Secondary | ICD-10-CM | POA: Diagnosis not present

## 2024-04-17 DIAGNOSIS — N390 Urinary tract infection, site not specified: Secondary | ICD-10-CM | POA: Diagnosis not present
